# Patient Record
Sex: Female | Born: 1990 | Hispanic: Yes | Marital: Single | Smoking: Never smoker
Health system: Southern US, Community
[De-identification: ages and names within clinical notes are randomized; demographics above are authoritative.]

---
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
## 2009-05-12 ENCOUNTER — Emergency Department: Payer: Self-pay | Admitting: Emergency Medicine

## 2009-09-24 ENCOUNTER — Emergency Department: Payer: Self-pay | Admitting: Emergency Medicine

## 2009-09-25 ENCOUNTER — Emergency Department: Payer: Self-pay | Admitting: Emergency Medicine

## 2019-01-30 ENCOUNTER — Other Ambulatory Visit: Payer: Self-pay

## 2019-01-30 ENCOUNTER — Telehealth: Payer: Self-pay

## 2019-01-30 DIAGNOSIS — Z20822 Contact with and (suspected) exposure to covid-19: Secondary | ICD-10-CM

## 2019-01-30 NOTE — Telephone Encounter (Signed)
[facility] Request COVID 19 test. 

## 2019-02-04 ENCOUNTER — Telehealth: Payer: Self-pay

## 2019-02-04 LAB — NOVEL CORONAVIRUS, NAA: SARS-CoV-2, NAA: DETECTED — AB

## 2019-02-04 NOTE — Telephone Encounter (Signed)
[facility] calling result of COVID-19 testing. Per lab note of Tiffany Pearson RN, Pt has been notified and provider notified.

## 2019-02-06 ENCOUNTER — Emergency Department: Payer: Self-pay

## 2019-02-06 ENCOUNTER — Emergency Department
Admission: EM | Admit: 2019-02-06 | Discharge: 2019-02-06 | Disposition: A | Discharge status: Home / Self Care | Payer: Self-pay | Attending: Emergency Medicine | Admitting: Emergency Medicine

## 2019-02-06 ENCOUNTER — Encounter: Payer: Self-pay | Admitting: Physician Assistant

## 2019-02-06 ENCOUNTER — Other Ambulatory Visit: Payer: Self-pay

## 2019-02-06 DIAGNOSIS — U071 COVID-19: Secondary | ICD-10-CM | POA: Insufficient documentation

## 2019-02-06 LAB — CBC WITH DIFFERENTIAL/PLATELET
Abs Immature Granulocytes: 0.01 10*3/uL (ref 0.00–0.07)
Basophils Absolute: 0 10*3/uL (ref 0.0–0.1)
Basophils Relative: 0 %
Eosinophils Absolute: 0 10*3/uL (ref 0.0–0.5)
Eosinophils Relative: 1 %
HCT: 41.4 % (ref 36.0–46.0)
Hemoglobin: 13.8 g/dL (ref 12.0–15.0)
Immature Granulocytes: 0 %
Lymphocytes Relative: 21 %
Lymphs Abs: 1.1 10*3/uL (ref 0.7–4.0)
MCH: 27.9 pg (ref 26.0–34.0)
MCHC: 33.3 g/dL (ref 30.0–36.0)
MCV: 83.8 fL (ref 80.0–100.0)
Monocytes Absolute: 0.2 10*3/uL (ref 0.1–1.0)
Monocytes Relative: 4 %
Neutro Abs: 3.9 10*3/uL (ref 1.7–7.7)
Neutrophils Relative %: 74 %
Platelets: 211 10*3/uL (ref 150–400)
RBC: 4.94 MIL/uL (ref 3.87–5.11)
RDW: 12.5 % (ref 11.5–15.5)
WBC: 5.2 10*3/uL (ref 4.0–10.5)
nRBC: 0 % (ref 0.0–0.2)

## 2019-02-06 LAB — URINALYSIS, COMPLETE (UACMP) WITH MICROSCOPIC
Bilirubin Urine: NEGATIVE
Glucose, UA: NEGATIVE mg/dL
Ketones, ur: 20 mg/dL — AB
Nitrite: NEGATIVE
Protein, ur: 30 mg/dL — AB
RBC / HPF: >50 RBC/hpf — ABNORMAL HIGH (ref 0–5)
Specific Gravity, Urine: 1.021 (ref 1.005–1.030)
pH: 7 (ref 5.0–8.0)

## 2019-02-06 LAB — TROPONIN I: Troponin I: <0.03 ng/mL (ref <0.03)

## 2019-02-06 LAB — COMPREHENSIVE METABOLIC PANEL
ALT: 25 U/L (ref 0–44)
AST: 26 U/L (ref 15–41)
Albumin: 4.1 g/dL (ref 3.5–5.0)
Alkaline Phosphatase: 74 U/L (ref 38–126)
Anion gap: 10 (ref 5–15)
BUN: 11 mg/dL (ref 6–20)
CO2: 24 mmol/L (ref 22–32)
Calcium: 8.6 mg/dL — ABNORMAL LOW (ref 8.9–10.3)
Chloride: 106 mmol/L (ref 98–111)
Creatinine, Ser: 0.47 mg/dL (ref 0.44–1.00)
GFR calc Af Amer: >60 mL/min (ref >60)
GFR calc non Af Amer: >60 mL/min (ref >60)
Glucose, Bld: 92 mg/dL (ref 70–99)
Potassium: 4 mmol/L (ref 3.5–5.1)
Sodium: 140 mmol/L (ref 135–145)
Total Bilirubin: 0.6 mg/dL (ref 0.3–1.2)
Total Protein: 8 g/dL (ref 6.5–8.1)

## 2019-02-06 LAB — FIBRIN DERIVATIVES D-DIMER (ARMC ONLY): Fibrin derivatives D-dimer (ARMC): 1215.17 ng/mL (FEU) — ABNORMAL HIGH (ref 0.00–499.00)

## 2019-02-06 LAB — POCT PREGNANCY, URINE: Preg Test, Ur: NEGATIVE

## 2019-02-06 MED ORDER — IOHEXOL 350 MG/ML SOLN
75.0000 mL | Freq: Once | INTRAVENOUS | Status: AC | PRN
  Administered 2019-02-06: 75 mL via INTRAVENOUS
  Filled 2019-02-06: qty 75

## 2019-02-06 MED ORDER — ALBUTEROL SULFATE HFA 108 (90 BASE) MCG/ACT IN AERS
5.0000 | INHALATION_SPRAY | Freq: Once | RESPIRATORY_TRACT | Status: AC
  Administered 2019-02-06: 5 via RESPIRATORY_TRACT
  Filled 2019-02-06: qty 6.7

## 2019-02-06 NOTE — ED Provider Notes (Signed)
[facility] Emergency Department Provider Note  ____________________________________________   None    (approximate)  I have reviewed the triage vital signs and the nursing notes.   HISTORY  Chief Complaint Cough, Hemoptysis, and COVID    HPI Cassandra Le is a 28 y.o. female presents emergency department complaining of shortness of breath cough and hemoptysis earlier today.  She was tested positive for COVID on Monday.  She has had more difficulty breathing.  She states she has had some nausea and vomiting along with diarrhea.  She denies chest pain at this time.    History reviewed. No pertinent past medical history.  There are no active problems to display for this patient.   History reviewed. No pertinent surgical history.  Prior to Admission medications   Not on File    Allergies Patient has no known allergies.  History reviewed. No pertinent family history.  Social History Social History   Tobacco Use  . Smoking status: Never Smoker  . Smokeless tobacco: Never Used  Substance Use Topics  . Alcohol use: Never    Frequency: Never  . Drug use: Never    Review of Systems  Constitutional: No fever/chills Eyes: No visual changes. ENT: No sore throat. Respiratory: Positive cough and shortness of breath Gastrointestinal: Positive for for vomiting and diarrhea Genitourinary: Negative for dysuria. Musculoskeletal: Negative for back pain. Skin: Negative for rash.    ____________________________________________   PHYSICAL EXAM:  VITAL SIGNS: ED Triage Vitals  Enc Vitals Group     BP 02/06/19 1735 114/66     Pulse Rate 02/06/19 1735 96     Resp 02/06/19 1735 16     Temp 02/06/19 1735 99.1 F (37.3 C)     Temp Source 02/06/19 1735 Oral     SpO2 02/06/19 1735 97 %     Weight 02/06/19 1741 150 lb (68 kg)     Height 02/06/19 1741 5\' 4"  (1.626 m)     Head Circumference --      Peak Flow --      Pain Score 02/06/19 1739 0      Pain Loc --      Pain Edu? --      Excl. in GC? --     Constitutional: Alert and oriented. Well appearing and in no acute distress. Eyes: Conjunctivae are normal.  Head: Atraumatic. Nose: No congestion/rhinnorhea. Mouth/Throat: Mucous membranes are moist.   Neck:  supple no lymphadenopathy noted Cardiovascular: Normal rate, regular rhythm. Heart sounds are normal Respiratory: Normal respiratory effort.  No retractions, lungs c t a  Abd: soft nontender bs normal all 4 quad GU: deferred Musculoskeletal: FROM all extremities, warm and well perfused Neurologic:  Normal speech and language.  Skin:  Skin is warm, dry and intact. No rash noted. Psychiatric: Mood and affect are normal. Speech and behavior are normal.  ____________________________________________   LABS (all labs ordered are listed, but only abnormal results are displayed)  Labs Reviewed  COMPREHENSIVE METABOLIC PANEL - Abnormal; Notable for the following components:      Result Value   Calcium 8.6 (*)    All other components within normal limits  FIBRIN DERIVATIVES D-DIMER ([facility] ONLY) - Abnormal; Notable for the following components:   Fibrin derivatives D-dimer ([facility]) 1,215.17 (*)    All other components within normal limits  URINALYSIS, COMPLETE (UACMP) WITH MICROSCOPIC - Abnormal; Notable for the following components:   Color, Urine YELLOW (*)    APPearance CLOUDY (*)  Hgb urine dipstick MODERATE (*)    Ketones, ur 20 (*)    Protein, ur 30 (*)    Leukocytes,Ua SMALL (*)    RBC / HPF >50 (*)    Bacteria, UA RARE (*)    All other components within normal limits  URINE CULTURE  TROPONIN I  CBC WITH DIFFERENTIAL/PLATELET  POC URINE PREG, ED  POCT PREGNANCY, URINE   ____________________________________________   ____________________________________________  RADIOLOGY  Chest x-ray shows atypical pneumonia  ____________________________________________   PROCEDURES  Procedure(s) performed:  Normal saline 1 L IV   Procedures    ____________________________________________   INITIAL IMPRESSION / ASSESSMENT AND PLAN / ED COURSE  Pertinent labs & imaging results that were available during my care of the patient were reviewed by me and considered in my medical decision making (see chart for details).   Patient is 28 year old female who was positive for COVID and has worsening symptoms.  Physical exam patient's vitals are normal.  Remainder the exam is unremarkable  CBC is normal, troponin is normal, comprehensive metabolic panel is normal, POC pregnancy is negative, d-dimer is increased at 1215.17, urinalysis shows 20 ketones small amount of leuks rare bacteria.  Urine culture was added.  Due to the elevated d-dimer and concerns of PE a CTA for PE was ordered which is negative.  Explained findings to the patient.  She is to continue to monitor symptoms and if worsening return emergency department.  She states she understands will comply.  Is discharged stable condition.    Cassandra Le was evaluated in Emergency Department on 02/06/2019 for the symptoms described in the history of present illness. She was evaluated in the context of the global COVID-19 pandemic, which necessitated consideration that the patient might be at risk for infection with the SARS-CoV-2 virus that causes COVID-19. Institutional protocols and algorithms that pertain to the evaluation of patients at risk for COVID-19 are in a state of rapid change based on information released by regulatory bodies including the CDC and federal and state organizations. These policies and algorithms were followed during the patient's care in the ED.   As part of my medical decision making, I reviewed the following data within the [place] notes reviewed and incorporated, Labs reviewed see above, EKG interpreted NSR, Old chart reviewed, Radiograph reviewed chest x-ray shows bilateral opacities typical  COVID-19, CTA for PE is negative evaluated by EM attending McShane, Notes from prior ED visits and [place] Controlled Substance Database  ____________________________________________   FINAL CLINICAL IMPRESSION(S) / ED DIAGNOSES  Final diagnoses:  COVID-19      NEW MEDICATIONS STARTED DURING THIS VISIT:  New Prescriptions   No medications on file     Note:  This document was prepared using Dragon voice recognition software and may include unintentional dictation errors.    Versie Starks, PA-C 02/06/19 2053    Carrie Mew, MD 02/06/19 [phone]

## 2019-02-06 NOTE — ED Notes (Signed)
PT given snacks with permission of PA Fisher

## 2019-02-06 NOTE — ED Notes (Signed)
Waiting on medication from Pharmacy.  

## 2019-02-06 NOTE — ED Notes (Signed)
Patient transported to CT 

## 2019-02-06 NOTE — ED Triage Notes (Signed)
Pt to ED from home c/o SOB, cough, and hemoptysis yesterday.  States called health department and was told to come in for evaluation after "very small amount" of blood in vomit per patient.  Pt was tested last Friday and results positive Monday for COVID.  Pt is ambulatory with steady gait, speaking in complete and coherent sentences, chest rise even and unlabored, in NAD at this time.

## 2019-02-06 NOTE — ED Notes (Signed)
Pt back from CT

## 2019-02-06 NOTE — Discharge Instructions (Addendum)
Follow-up with regular doctor.  Return emergency department worsening.  Continue to monitor your symptoms.

## 2019-02-06 NOTE — ED Notes (Signed)
Dr. McShane at bedside.  

## 2019-02-08 LAB — URINE CULTURE

## 2020-01-12 IMAGING — CT CT ANGIOGRAPHY CHEST
2 of 6 series · 19 of 46 positions shown · IV contrast (APPLIED)
Comparison: Chest x-ray 02/06/2019

CLINICAL DATA: [id number] positive.  Shortness of breath.

EXAM:
CT ANGIOGRAPHY CHEST WITH CONTRAST
TECHNIQUE: Multidetector CT imaging of the chest was performed using the
standard protocol during bolus administration of intravenous
contrast. Multiplanar CT image reconstructions and MIPs were
obtained to evaluate the vascular anatomy.
CONTRAST:  75mL OMNIPAQUE IOHEXOL 350 MG/ML SOLN

[Series 5: thins · axial · 0.58mm/px · z∈[+99,+321]mm · 16 of 244 slices shown]
[im 11/244  lung]
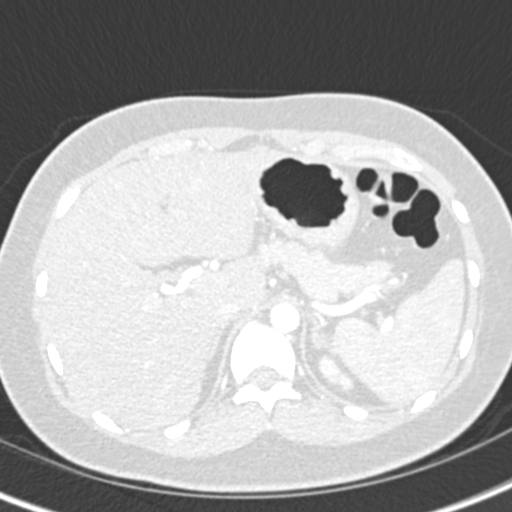
[im 32/244  soft-tissue]
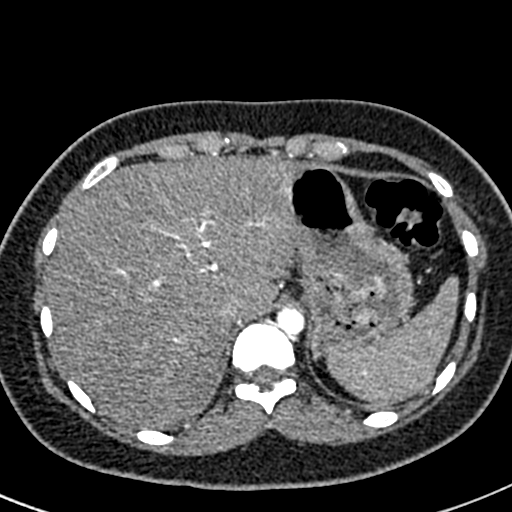
[im 43/244  lung]
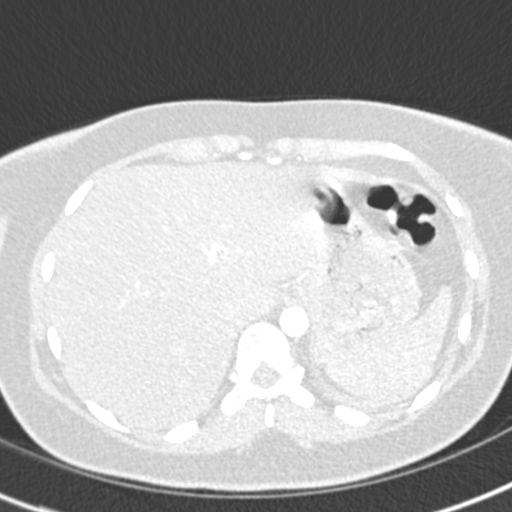
[im 53/244  soft-tissue]
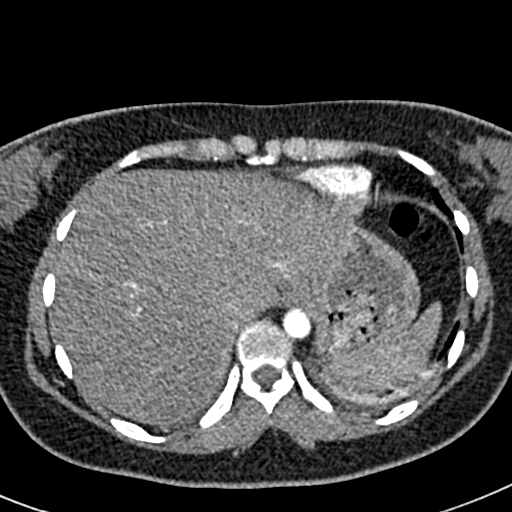
[im 74/244  lung]
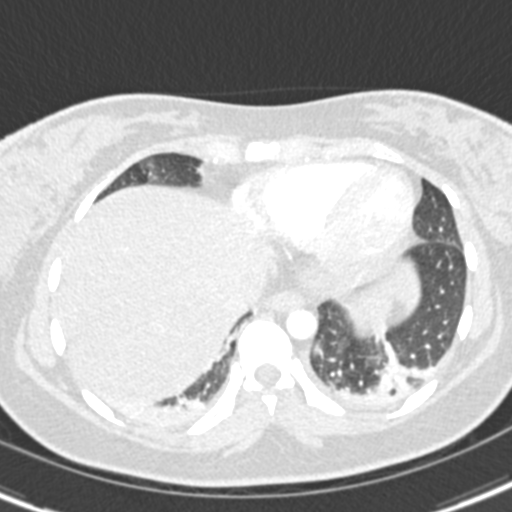
[im 85/244  soft-tissue]
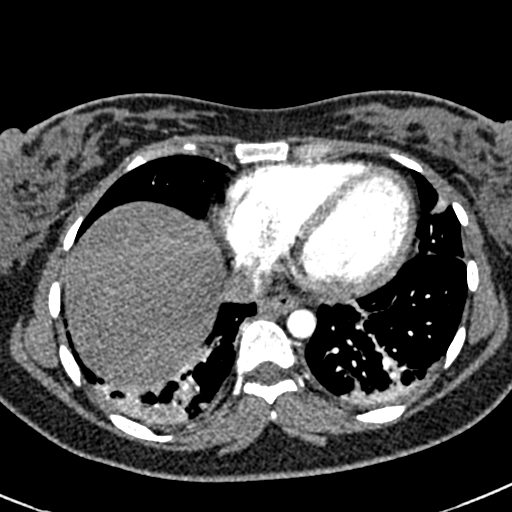
[im 96/244  lung]
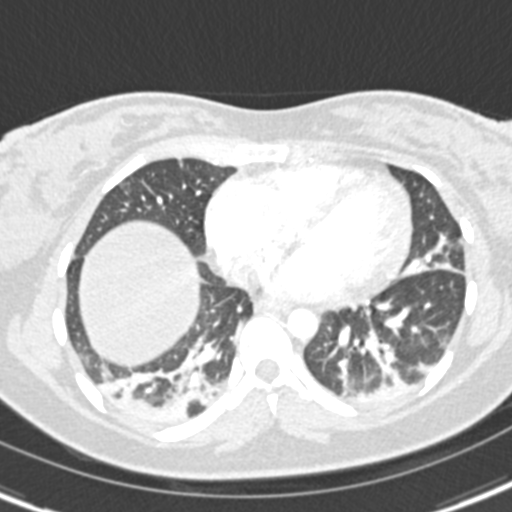
[im 117/244  soft-tissue]
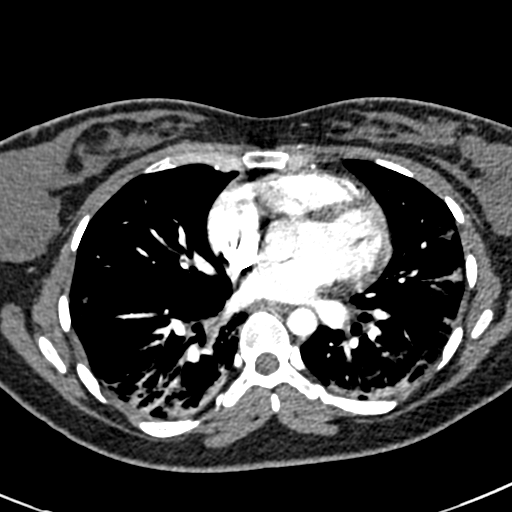
[im 127/244  lung]
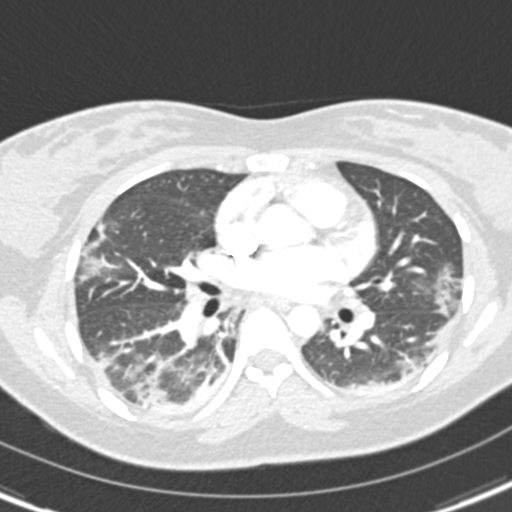
[im 148/244  soft-tissue]
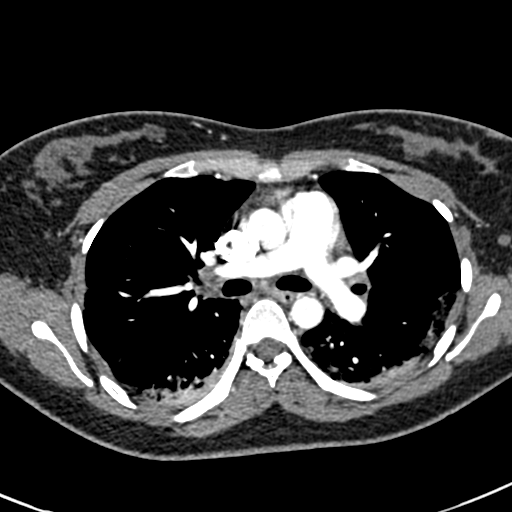
[im 159/244  lung]
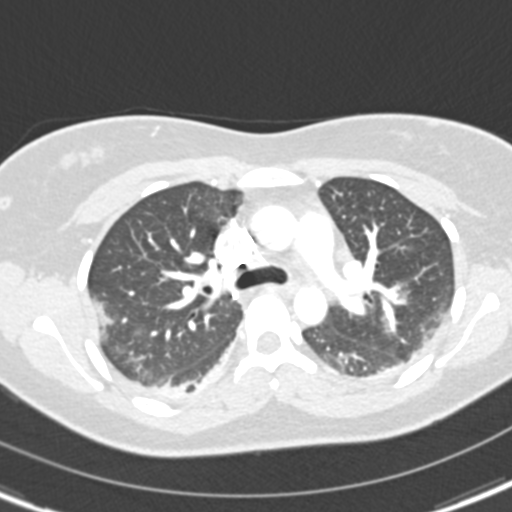
[im 170/244  soft-tissue]
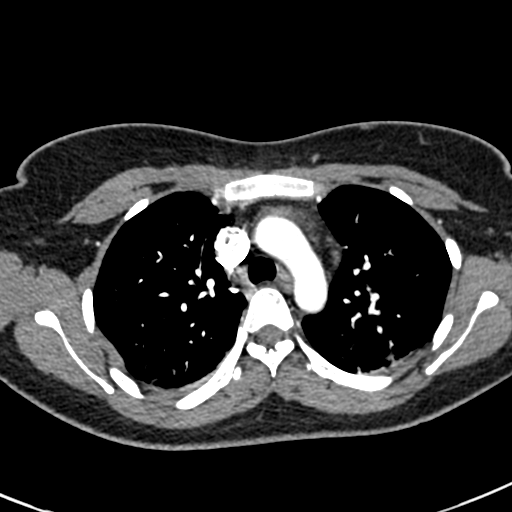
[im 191/244  lung]
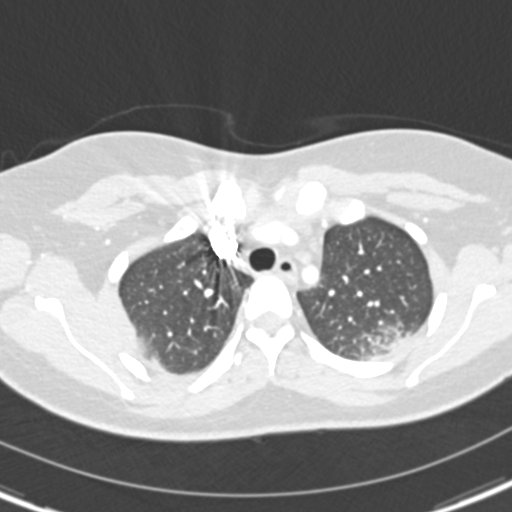
[im 201/244  soft-tissue]
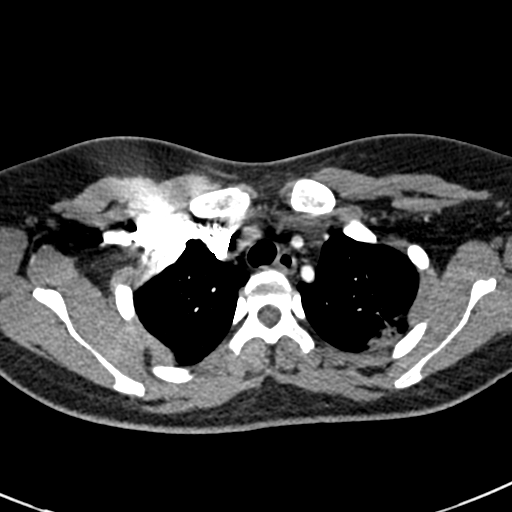
[im 212/244  lung]
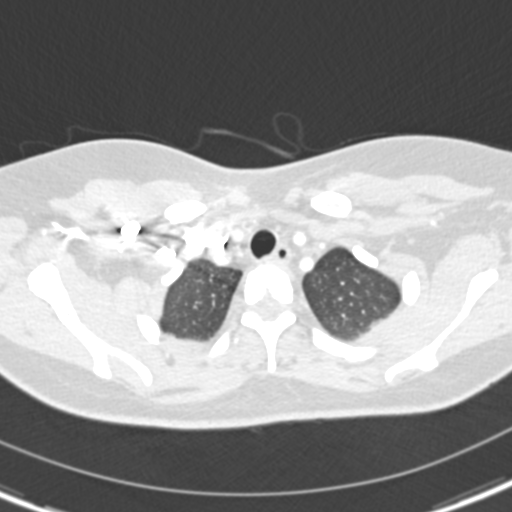
[im 233/244  soft-tissue]
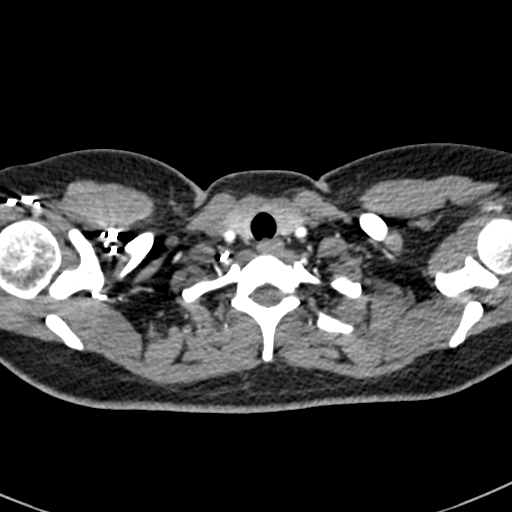

[Series 7: coronal mpr · coronal · 0.52mm/px · 3 of 65 slices shown]
[im 17/65  soft-tissue]
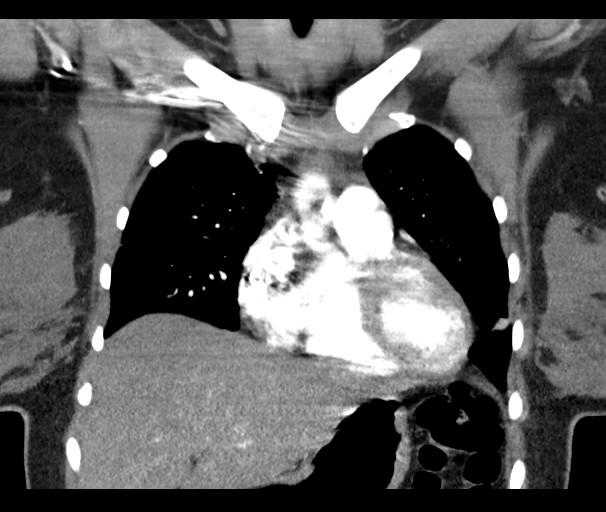
[im 33/65  soft-tissue]
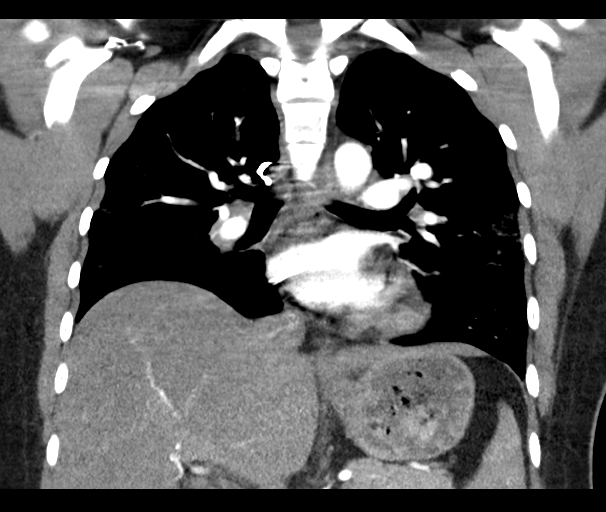
[im 49/65  soft-tissue]
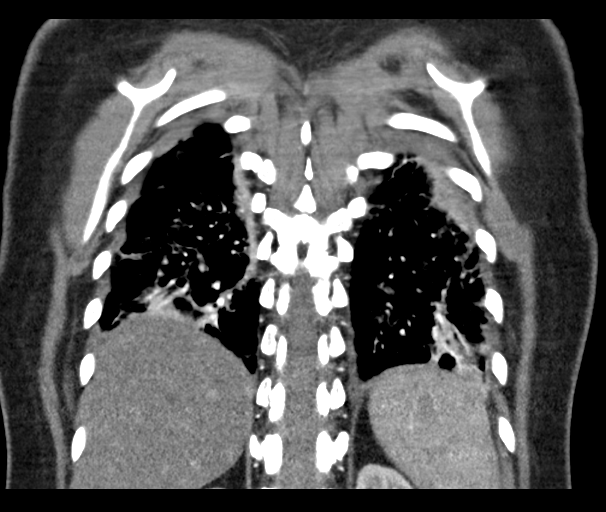

[19 of 46 positions shown; findings below may reference images not displayed]

FINDINGS: Cardiovascular: No filling defects in the pulmonary arteries to
suggest pulmonary emboli. Heart is normal size. Aorta is normal
caliber.

Mediastinum/Nodes: No mediastinal, hilar, or axillary adenopathy.

Lungs/Pleura: There are patchy peripheral ground-glass airspace
opacities compatible with known given history of coronavirus. Linear
and platelike airspace opacities in both lower lobes, likely
atelectasis. No effusions.

Upper Abdomen: Imaging into the upper abdomen shows no acute
findings.

Musculoskeletal: Chest wall soft tissues are unremarkable. No acute
bony abnormality.

Review of the MIP images confirms the above findings.
IMPRESSION: No evidence of pulmonary embolus.

Patchy bilateral peripheral ground-glass airspace opacities
compatible with [id number] infection.

Linear and platelike opacities in the lower lobes most compatible
with atelectasis.

## 2020-01-12 IMAGING — DX PORTABLE CHEST - 1 VIEW
1 series · 1 of 1 positions shown · non-contrast
Comparison: CT abdomen from 02/19/2008

CLINICAL DATA: Pt complains of SOB, cough, and hemoptysis
yesterday. States called health department and was told to come in
for evaluation after "very small amount" of blood in vomit per
patient. Pt was tested [REDACTED] and results positive [REDACTED] for
COVID

EXAM:
PORTABLE CHEST 1 VIEW

[chest ap]
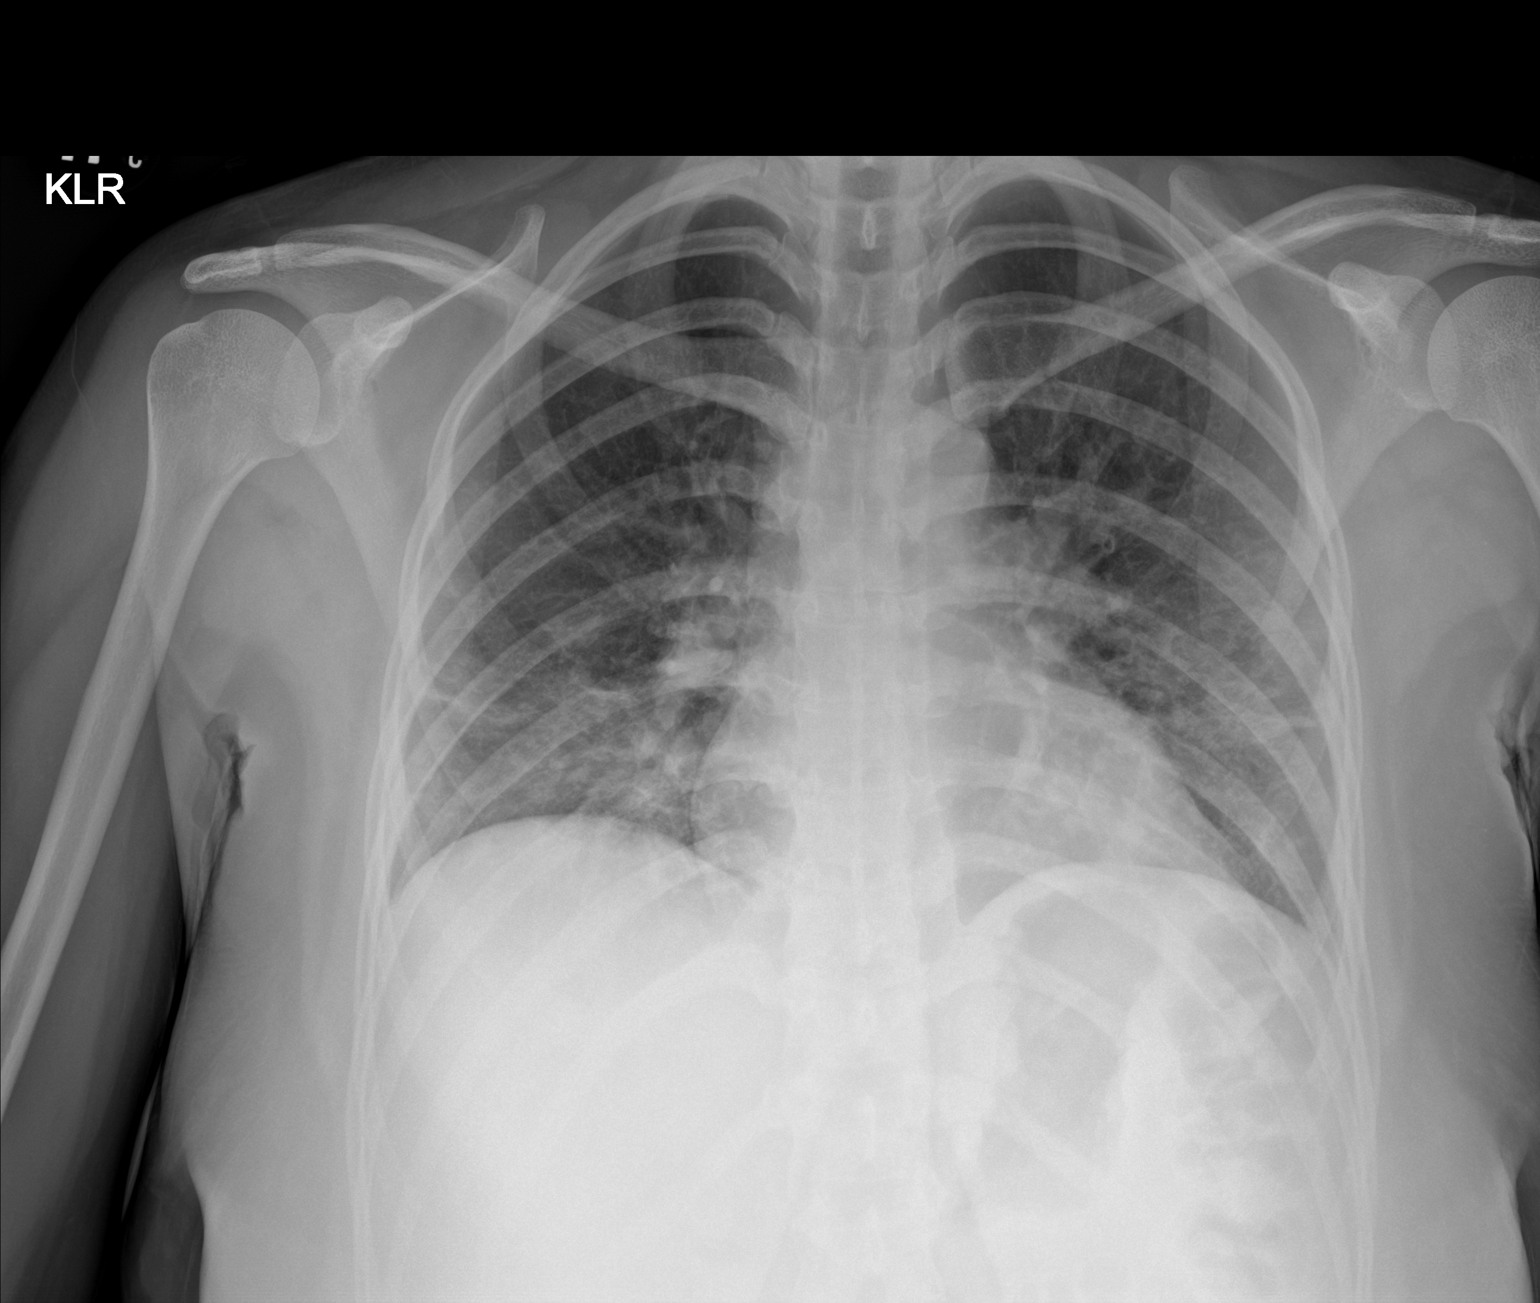

[1 of 1 positions shown; findings below may reference images not displayed]

FINDINGS: Indistinct perihilar and basilar opacities are present inter bagel
with some bandlike peripheral opacities. Minimal airway thickening.
Cardiac and mediastinal margins appear normal. No blunting of the
costophrenic angles.
IMPRESSION: 1. Indistinct perihilar and basilar airspace opacities probably
reflect atypical pneumonia in this patient who has tested positive
for [id number]. There is felt to be a component of subsegmental
atelectasis in the periphery of both lungs.
2. Airway thickening is present, suggesting bronchitis or reactive
airways disease.

## 2020-04-25 ENCOUNTER — Ambulatory Visit: Payer: Self-pay

## 2020-09-21 ENCOUNTER — Other Ambulatory Visit: Payer: Self-pay

## 2020-09-21 DIAGNOSIS — Z20822 Contact with and (suspected) exposure to covid-19: Secondary | ICD-10-CM

## 2020-09-22 LAB — SARS-COV-2, NAA 2 DAY TAT

## 2020-09-22 LAB — NOVEL CORONAVIRUS, NAA: SARS-CoV-2, NAA: NOT DETECTED
